# Patient Record
Sex: Female | Born: 1965 | Race: White | Hispanic: No | Marital: Married | Smoking: Never smoker
Health system: Southern US, Community
[De-identification: ages and names within clinical notes are randomized; demographics above are authoritative.]

## PROBLEM LIST (undated history)

## (undated) DIAGNOSIS — N83209 Unspecified ovarian cyst, unspecified side: Secondary | ICD-10-CM

## (undated) HISTORY — PX: CHOLECYSTECTOMY: SHX55

---

## 2009-09-29 ENCOUNTER — Ambulatory Visit: Payer: Self-pay | Admitting: Family Medicine

## 2009-09-30 ENCOUNTER — Encounter: Payer: Self-pay | Admitting: Obstetrics & Gynecology

## 2009-09-30 LAB — CONVERTED CEMR LAB
ALT: <8 units/L (ref 0–35)
Alkaline Phosphatase: 69 units/L (ref 39–117)
Cholesterol: 188 mg/dL (ref 0–200)
Glucose, Bld: 95 mg/dL (ref 70–99)
HCT: 35.6 % — ABNORMAL LOW (ref 36.0–46.0)
HDL: 45 mg/dL (ref >39)
Hemoglobin: 10 g/dL — ABNORMAL LOW (ref 12.0–15.0)
MCV: 77.9 fL — ABNORMAL LOW (ref 78.0–100.0)
Potassium: 3.6 meq/L (ref 3.5–5.3)
RBC: 4.57 M/uL (ref 3.87–5.11)
RDW: 18.2 % — ABNORMAL HIGH (ref 11.5–15.5)
Sodium: 140 meq/L (ref 135–145)
TSH: 3.203 microintl units/mL (ref 0.350–4.500)
VLDL: 33 mg/dL (ref 0–40)
WBC: 7.7 10*3/uL (ref 4.0–10.5)

## 2009-10-14 ENCOUNTER — Encounter: Admission: RE | Admit: 2009-10-14 | Discharge: 2009-10-14 | Payer: Self-pay | Admitting: Obstetrics & Gynecology

## 2009-10-21 ENCOUNTER — Ambulatory Visit: Payer: Self-pay | Admitting: Obstetrics & Gynecology

## 2009-10-29 ENCOUNTER — Encounter
Admission: RE | Admit: 2009-10-29 | Discharge: 2009-10-29 | Payer: Federal, State, Local not specified - PPO | Admitting: Obstetrics & Gynecology

## 2009-10-29 IMAGING — US US BREAST BILATERAL
1 series · 13 of 25 positions shown · non-contrast
Comparison: [DATE]

CLINICAL DATA: Screening call back for masses and calcifications
in both breasts

DIGITAL DIAGNOSTIC BILATERAL MAMMOGRAM WITHOUT CAD AND BILATERAL
BREAST ULTRASOUND:

[Series 1: us breast bilateral · 13 of 32 slices shown]
[im 1/32]
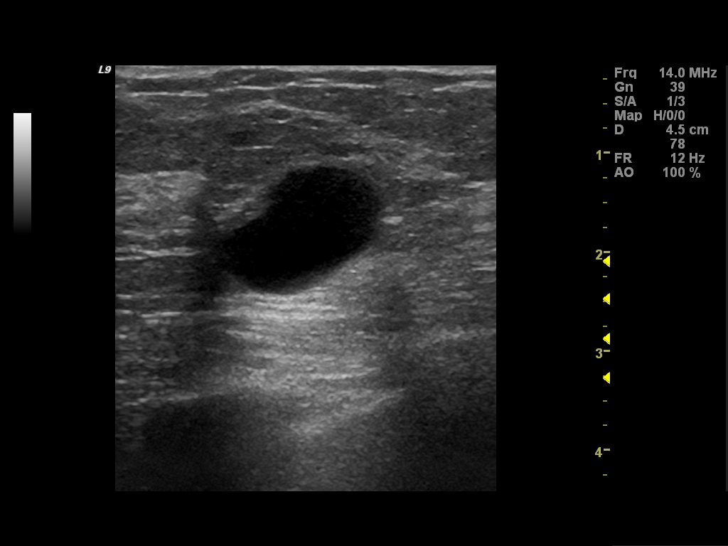
[im 3/32]
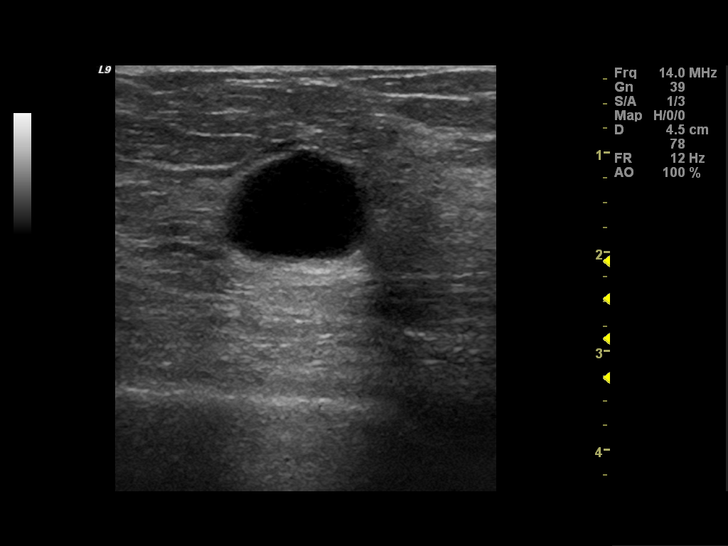
[im 6/32]
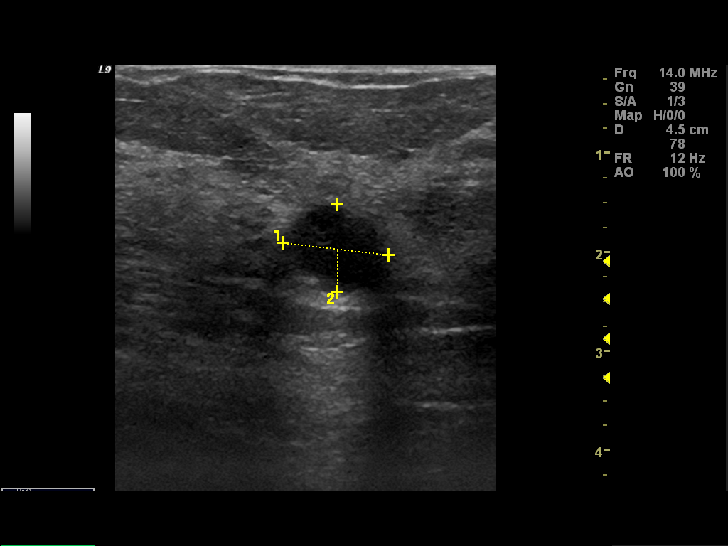
[im 8/32]
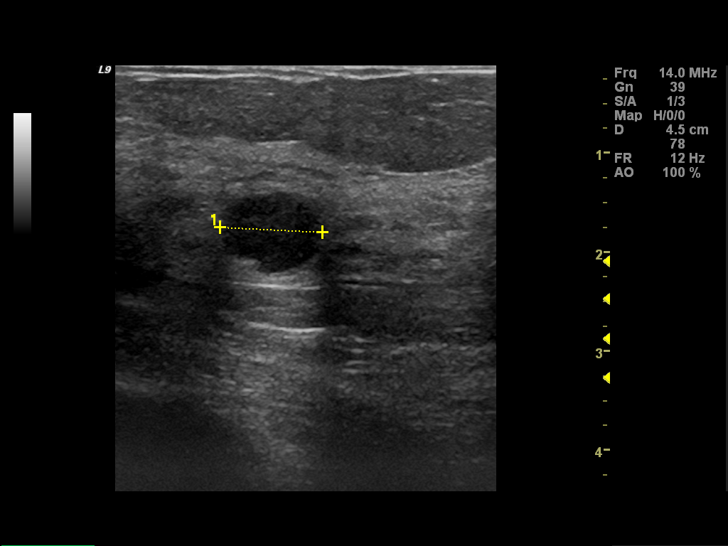
[im 11/32]
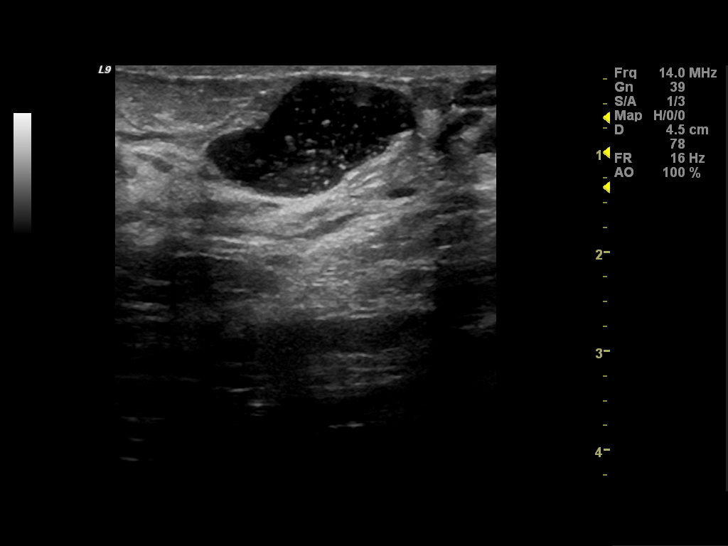
[im 13/32]
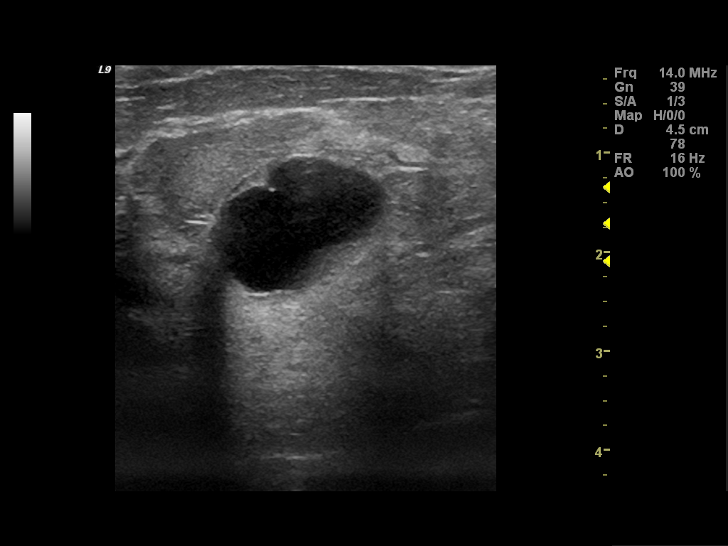
[im 16/32]
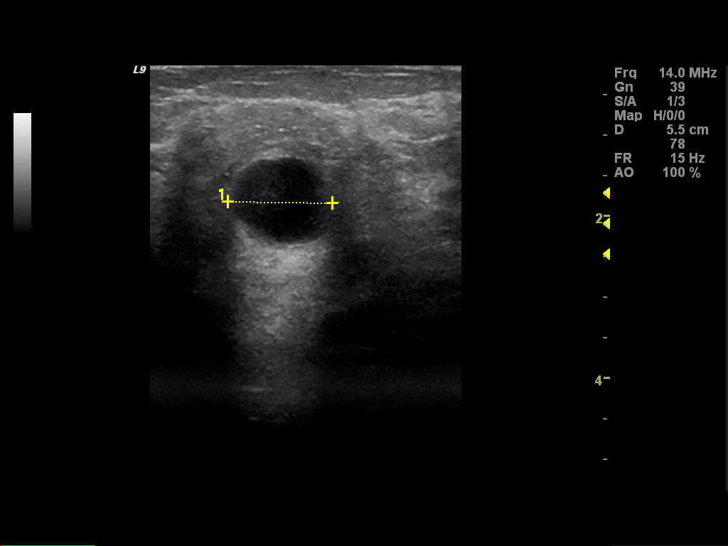
[im 19/32]
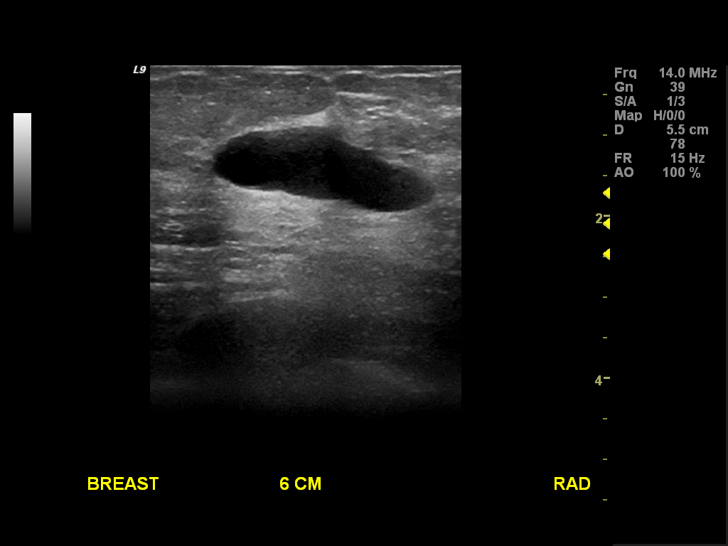
[im 21/32]
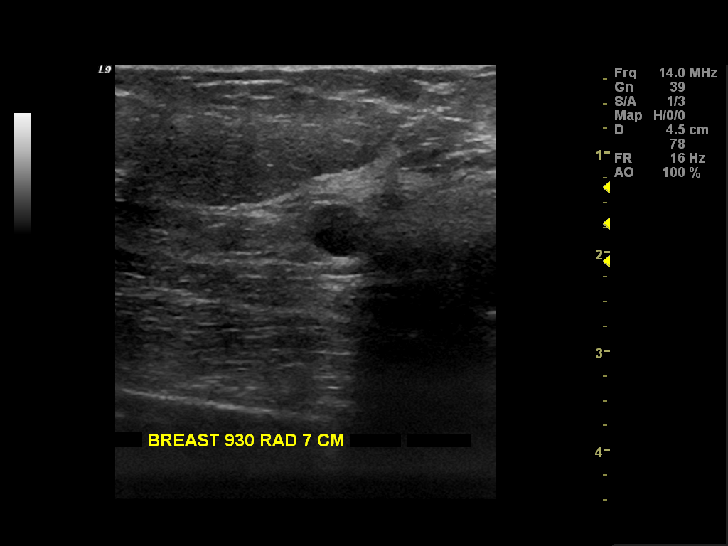
[im 24/32]
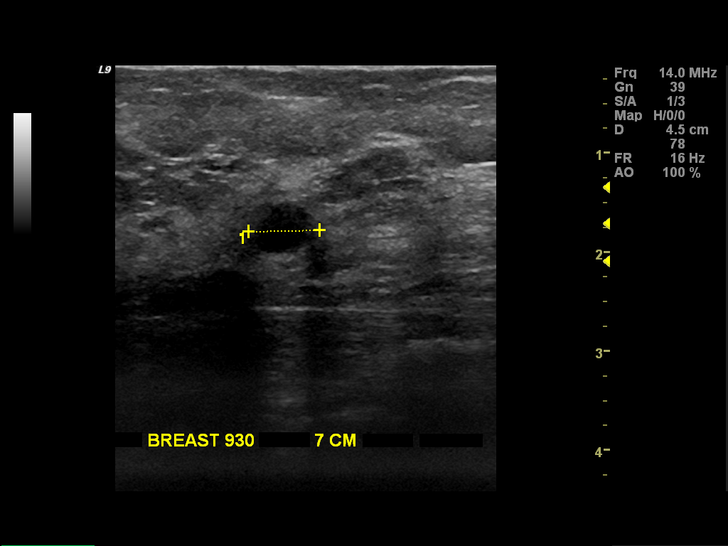
[im 26/32]
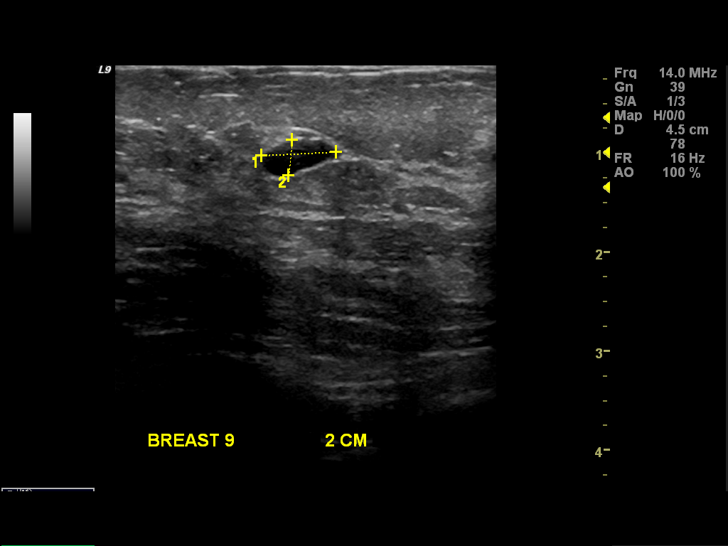
[im 29/32]
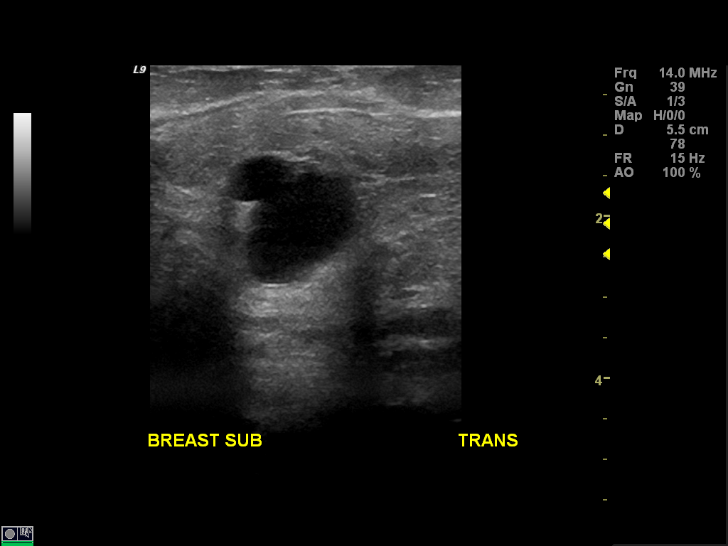
[im 32/32]
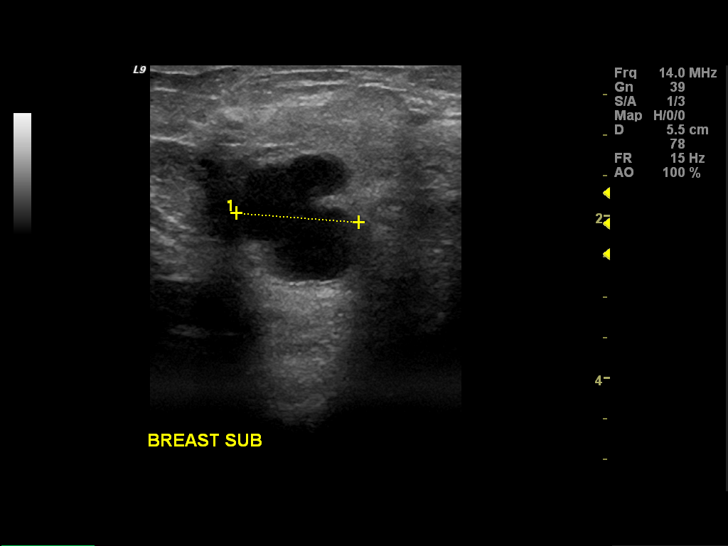

[13 of 25 positions shown; findings below may reference images not displayed]

FINDINGS: Spot magnification views confirm the presence of
calcifications in both breasts.  The calcifications and the left
breast are spanning zones A and B, promptly punctate with some
amorphous forms which layer on the 90 degree lateral views
suggesting some relate to milk of calcium.  No linear forms or
linear orientation is seen.  On the right, the calcifications span
zone A and B, are predominately punctate and round without linear
forms or linear orientation.  Circumscribed masses are seen in the
breast, bilaterally.

On physical exam, I palpate mobile mass in the breast bilaterally.

Ultrasound is performed, showing numerous cysts.  In the left
breast, three cysts are imaged including a 1.6 x 1.3 x 1.4 cm cyst
in the 2 o'clock position, 5 cm from the nipple, 1.1 x 0.9 x 1.0 cm
in the 2 o'clock position, 4 cm from the left nipple and 1.6 x
x 2.1 cm in the 12 o'clock position, 1 cm from the nipple.

On the right side, several cysts are also imaged including a 1.7 x
1.3 x 1.3 cm in the 12 o'clock position, 2 cm from the nipple,
x x 1.1 x 2.7 cm in the [DATE] position, 6 cm from the nipple, 0.7 x
0.6 x 0.7 cm in the [DATE] position, 7 cm from the nipple, 0.8 x
x 0.8 cm in the 9 o'clock position, 2 cm from the nipple and 1.7 x
1.5 x 1.5 cm in the right subareolar region.  These cysts
correspond to the masses seen mammographically.
IMPRESSION: Cysts bilaterally.  Benign calcifications, bilaterally.  Recommend
routine screening mammography in 1 year.  Prior films will be
requested for comparison and if necessary, an addendum will be
placed on this report.

BI-RADS CATEGORY 2:  Benign finding(s).

## 2009-12-28 ENCOUNTER — Ambulatory Visit (HOSPITAL_COMMUNITY): Admission: RE | Admit: 2009-12-28 | Discharge: 2009-12-28 | Payer: Self-pay | Admitting: Obstetrics & Gynecology

## 2009-12-28 ENCOUNTER — Ambulatory Visit: Payer: Self-pay | Admitting: Obstetrics & Gynecology

## 2010-05-26 LAB — CBC
MCHC: 31.3 g/dL (ref 30.0–36.0)
MCV: 75.5 fL — ABNORMAL LOW (ref 78.0–100.0)
RBC: 4.09 MIL/uL (ref 3.87–5.11)

## 2010-07-27 NOTE — Assessment & Plan Note (Signed)
Sydney Cantu NO.:  [id number]   MEDICAL RECORD NO.:  [id number]          PATIENT TYPE:  POB   LOCATION:  [facility] at [place]         FACILITY:  [facility]   PHYSICIAN:  Tinnie Gens, MD        DATE OF BIRTH:  08/13/1965   DATE OF SERVICE:  09/29/2009                                  CLINIC NOTE   CHIEF COMPLAINT:  Yearly exam.   HISTORY OF PRESENT ILLNESS:  The patient is a 45 year old gravida 4,  para 4-0-0-3, who has not been to see a doctor for sometime.  Her last  Pap was in 2007.  She has no history of abnormal Pap smears, but she  continues to have regular cycles, although her bleeding has gotten  heavier over the last couple of years and in fact she reports that her  bleeding is a lot heavier than it used to be.  She continues to have  monthly cycles until this last month.  Her LMP is Jul 28, 2009.  Her UPT  is negative today.  Her partner has had a vasectomy.  She does not need  a birth control.   PAST MEDICAL HISTORY:  Negative.   PAST SURGICAL HISTORY:  She had a laparoscopy for left lower quadrant  pain, had a scar tissue removed from her ovary and a laparoscopic  cholecystectomy and had a lumpectomy that was benign in 2006.   MEDICATIONS:  She is on none.   ALLERGIES:  None known.   OBSTETRICAL HISTORY:  She is G4, P4, she lost 1 child due to leukemia.  She has had 4 vaginal deliveries including a 10-pound 6-ounce baby in  her last pregnancy.   GYNECOLOGIC HISTORY:  Menarche at age 1.  Cycles are monthly every 28  days, lasts 4 days with minimal pain.  She has no history of abnormal  Pap smear or STD.   FAMILY HISTORY:  Significant for diabetes, heart disease, hypertension  and colon cancer in her mother at age 19.   SOCIAL HISTORY:  She is a stay-at-home mom.  She does not smoke, drink  or do any other drugs.  She is married.   REVIEW OF SYSTEMS:  A 14-point review of systems reviewed.  Please see  GYN history in the chart, but is  negative.   PHYSICAL EXAMINATION:  VITAL SIGNS:  Reviewed, her blood pressure is  144/87, weight 178 today.  GENERAL:  She is a well-developed, well-nourished female in no acute  distress.  HEENT:  Normocephalic, atraumatic.  Sclerae anicteric.  NECK:  Supple.  Normal thyroid.  LUNGS:  Clear bilaterally.  CV:  Regular rate and rhythm.  No rubs, gallops, or murmurs.  ABDOMEN:  Soft, nontender, nondistended.  GU:  Normal external female genitalia.  BUS is normal.  Vagina is pink  and rugated.  Cervix is parous without lesion.  Uterus is approximately  8 weeks' size, globular, firm, mobile and nontender.  Adnexa without  mass or tenderness.   IMPRESSION:  1. Gynecologic exam with Pap.  2. Menorrhagia.   PLAN:  1. Pap smear today.  2. Mammogram.  3. Lipid, CMP, CBC, TSH, FSH today.  4. Pelvic sonogram.  5. Return in 2 weeks for endometrial sampling given her heavy periods.           ______________________________  Tinnie Gens, MD     TP/MEDQ  D:  09/29/2009  T:  09/30/2009  Job:  [id number]

## 2010-07-27 NOTE — Assessment & Plan Note (Signed)
NAMELATESA, FRATTO NO.:  [id number]   MEDICAL RECORD NO.:  [id number]          PATIENT TYPE:  POB   LOCATION:  [facility] at [place]         FACILITY:  [facility]   PHYSICIAN:  Allie Bossier, MD        DATE OF BIRTH:  March 20, 1965   DATE OF SERVICE:  10/21/2009                                  CLINIC NOTE   Sydney Cantu is a 45 year old married white G4, P4, A1 who was seen by Dr.  Shawnie Pons on September 29, 2009, for the complaint of worsening menorrhagia over  the last several years.  She says that in the past she been treated for  anemia and has been attributed to her heavy periods.  Dr. Shawnie Pons did a  CBC which showed a hemoglobin to be 10.  Her cholesterol was normal.  Her TSH was normal and her FSH was 5.7.  A Pap smear was normal as well.  An ultrasound showed the endometrium to be thickened to 2.6-cm with one  and possibly two separate nodular-like areas that her consistent with  polyps.  Her ovaries appeared normal and her myometrium showed the  possibility of adenomyosis.   PAST MEDICAL HISTORY:  Negative.   PAST SURGICAL HISTORY:  Laparoscopy for left lower quadrant pain,  laparoscopic cholecystectomy, and a lumpectomy.   MEDICATIONS:  None.   ALLERGIES:  No known drug allergies.  No LATEX allergies.  No food  allergies.   OBSTETRICAL HISTORY:  She has had 4 vaginal deliveries and one of her  children died due to leukemia.   FAMILY HISTORY:  Significant for diabetes, heart disease, hypertension,  and colon cancer.   SOCIAL HISTORY:  She is a single mom.  She denies alcohol, tobacco, or  drugs.  She is married.  Her husband has had a vasectomy.   REVIEW OF SYSTEMS:  Negative.   PHYSICAL EXAMINATION:  GENERAL:  Well-nourished, well-hydrated pleasant  white female.  VITAL SIGNS:  Height 5 feet 8 inches, weight 175, blood pressure 141/94,  pulse 92.  HEENT:  Normal.  HEART:  Regular rate and rhythm.  LUNGS:  Clear to auscultation bilaterally.  BREASTS:  Normal.  ABDOMEN:  Benign.  EXTERNAL GENITALIA:  Normal.  PELVIC:  Uterus is about 8-week size, globular, firm, mobile, nontender.  Adnexa are without masses or tenderness.   ASSESSMENT AND PLAN:  Menorrhagia resulting in anemia.  Instead of doing  an endometrial biopsy, I have recommended that we do a D and C and  hysteroscopy because of the presence of polyps.  Risks of surgery  explained, understood, and accepted, and I will see her.  She wishes to  have a surgery scheduled in about a month because her husband has just  had surgery on his right foot and somewhat incapacitated.      Allie Bossier, MD     MCD/MEDQ  D:  10/21/2009  T:  10/22/2009  Job:  [id number]

## 2011-12-22 IMAGING — US US PELVIS COMPLETE MODIFY
1 series · 13 of 25 positions shown · non-contrast
Comparison: None.

CLINICAL DATA: Irregular menses.  Heavy painful periods.  LMP
approximately 2 weeks ago.



[Series 1: us pelvis complete modify · 0.28mm/px · 13 of 54 slices shown]
[im 1/54]
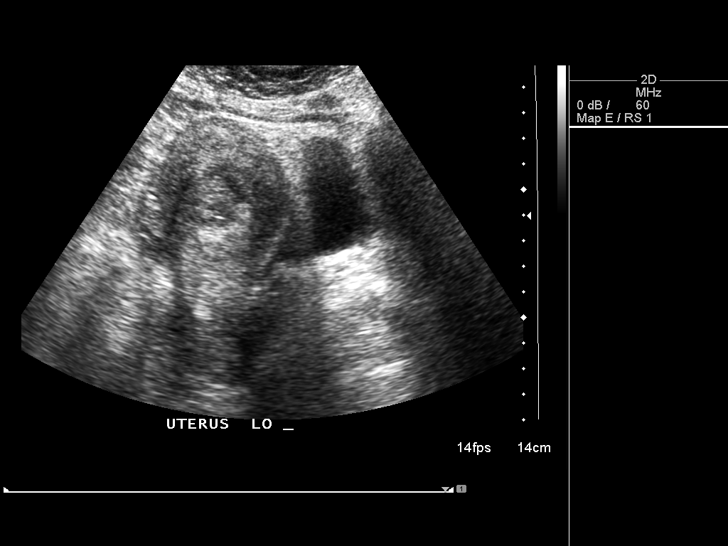
[im 5/54]
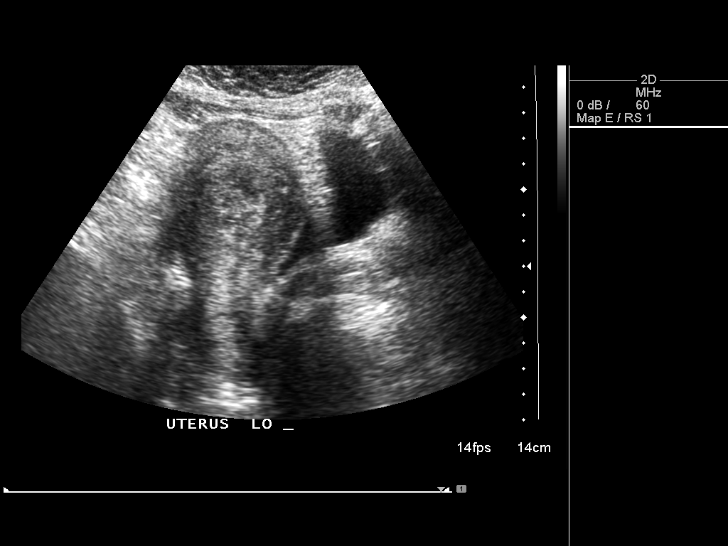
[im 9/54]
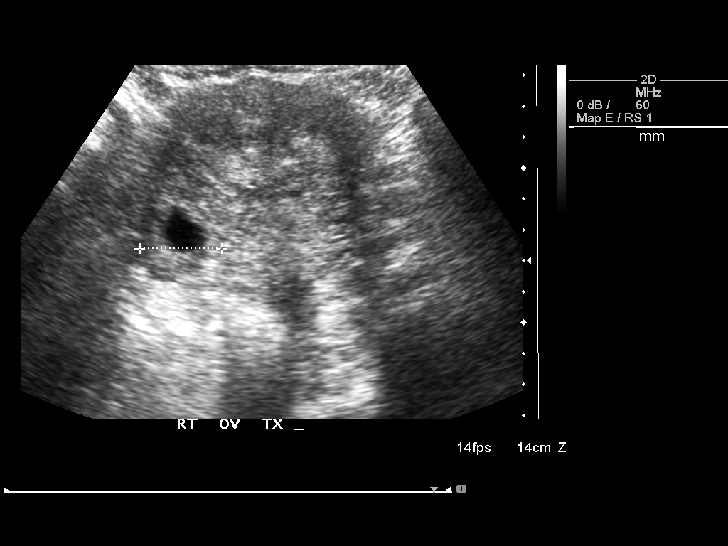
[im 14/54]
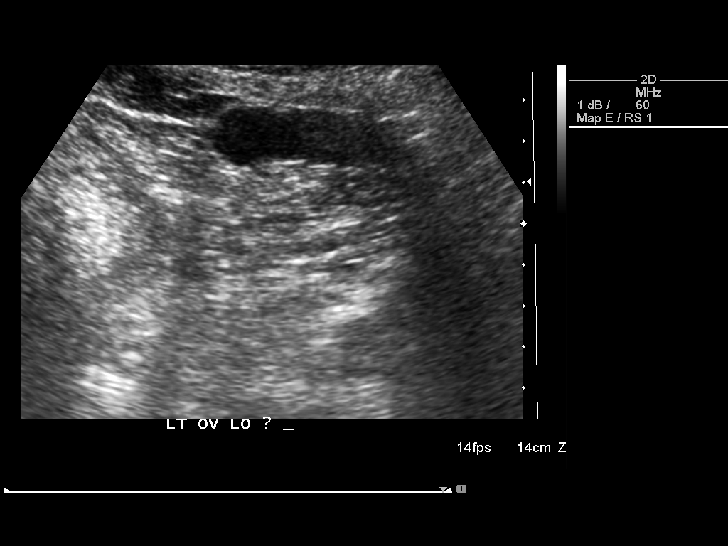
[im 18/54]
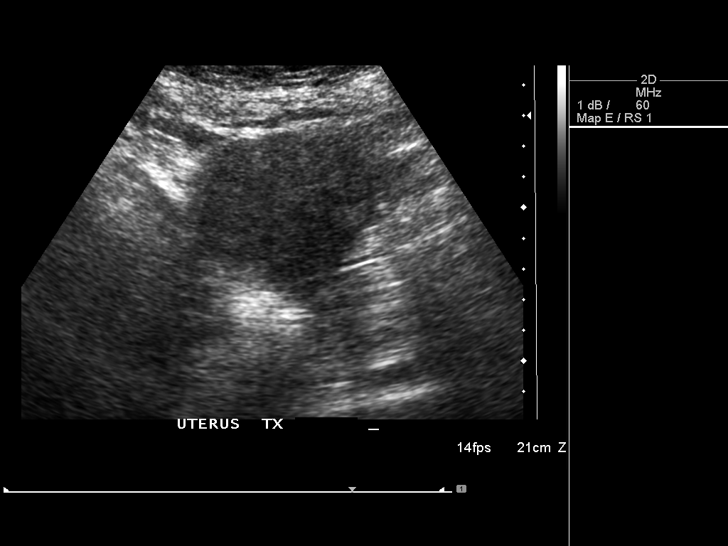
[im 23/54]
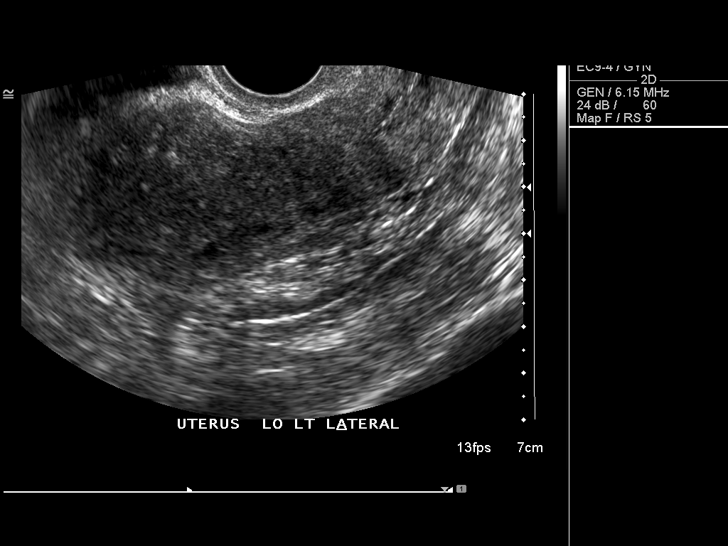
[im 27/54]
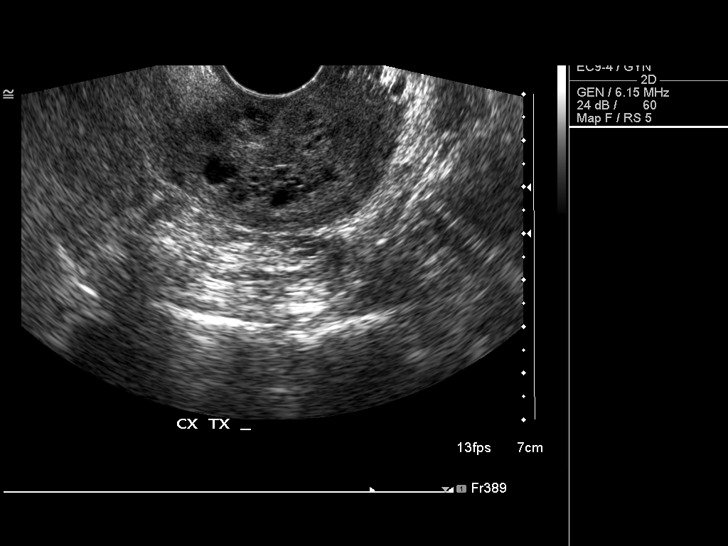
[im 31/54]
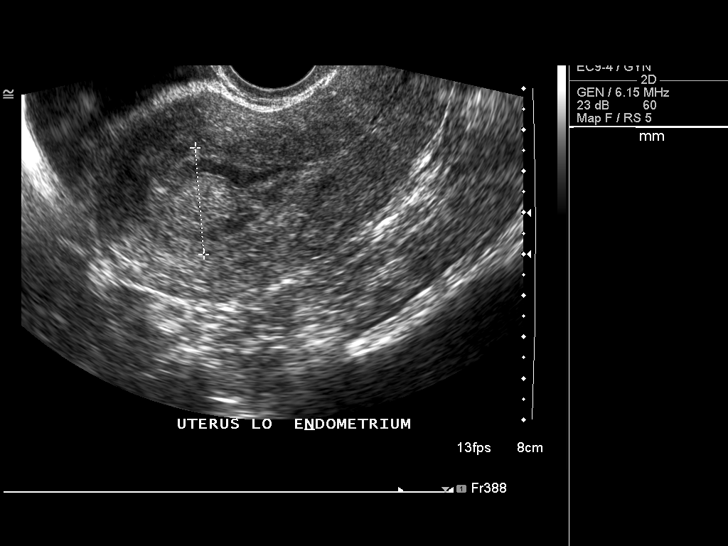
[im 36/54]
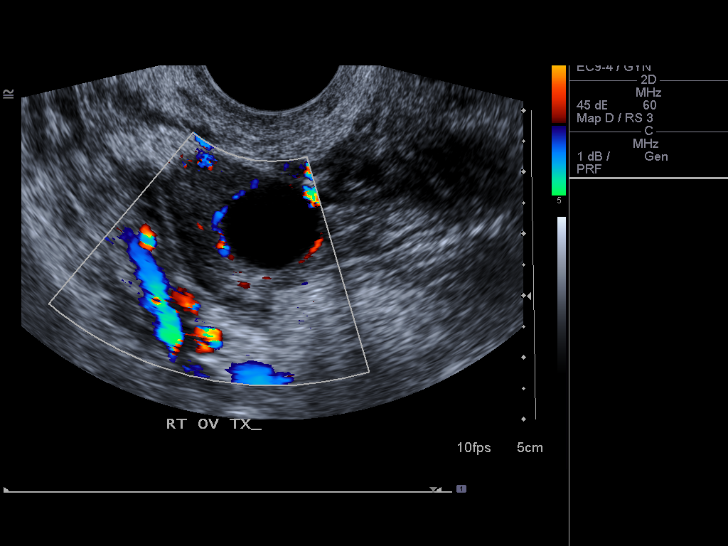
[im 40/54]
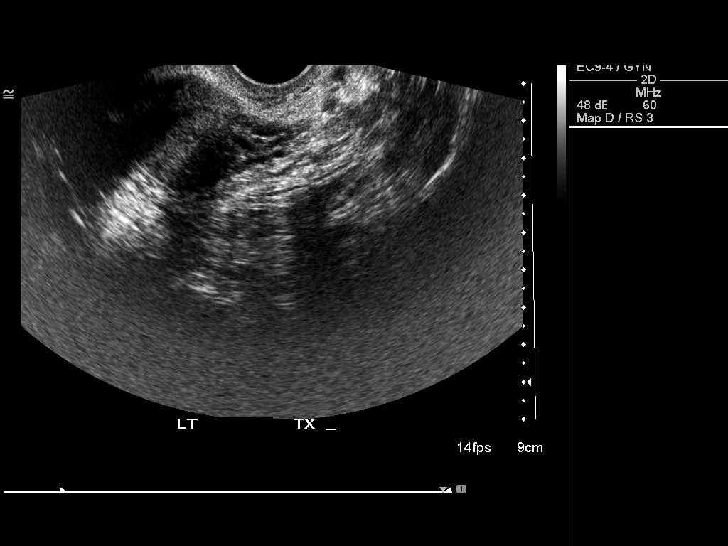
[im 45/54]
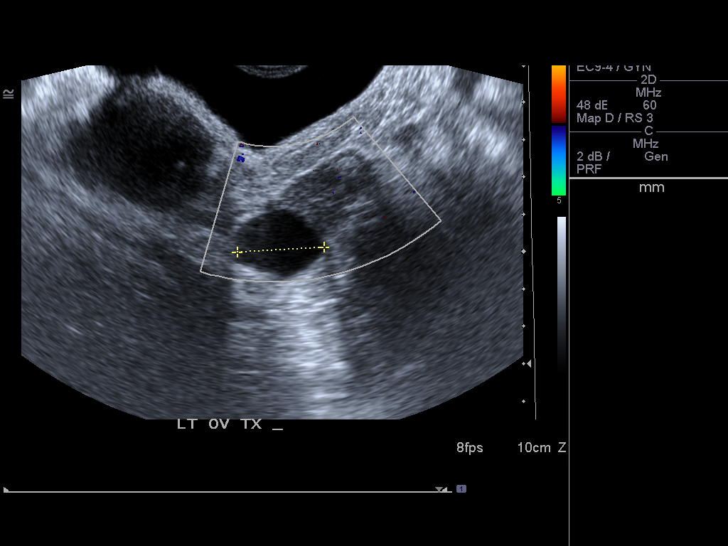
[im 49/54]
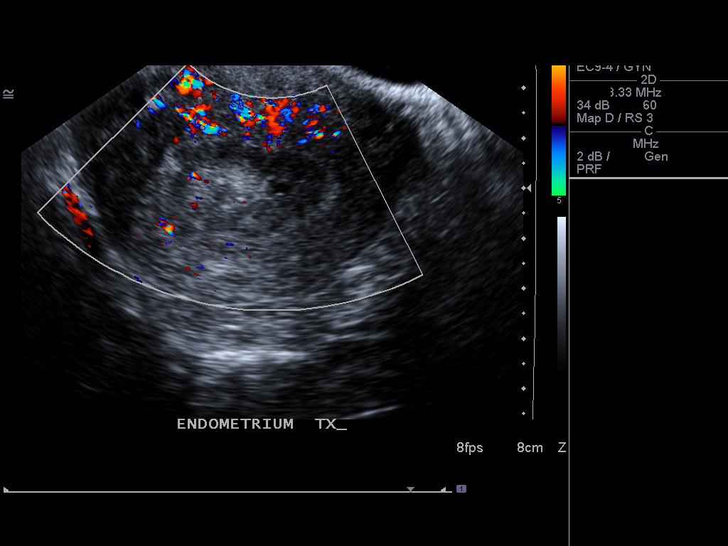
[im 54/54]
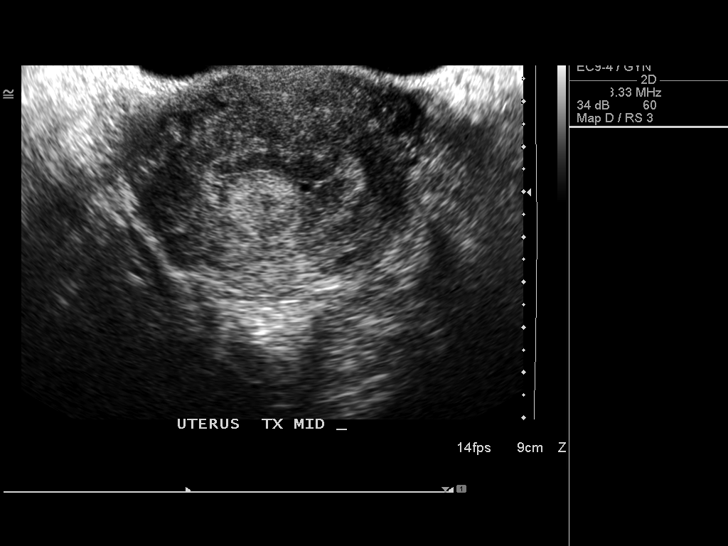

[13 of 25 positions shown; findings below may reference images not displayed]

FINDINGS: Uterus measures 10.5 cm length by 5.1 centers AP diameter by 6.3 cm
and width.  No focal uterine mass is identified.  The echotexture
is mildly heterogeneous.

Endometrium is abnormal.  The endometrial canal is thickened to
cm.  There is at least one and possibly two separate echogenic
nodular mass-like areas projecting into the endometrial canal from
the uterine fundus, and surrounded by a rim of endometrial fluid.
Color vascular flow is identified to these nodular areas, the
largest of which measures 1.4 cm AP diameter, and the smaller and
more anterior of which measures 0.8 cm.

Right Ovary measures 3.8 x 2.1 x 2.4 cm.  Normal in size, contains
a dominant follicle.  Color Doppler flow is identified to the right
ovary.

Left Ovary measures 4.1 x 3.0 x 2.7 cm and contains a dominant
follicle.

Other Findings:  No free pelvic fluid is identified.
IMPRESSION: 1.  Nodular echogenic thickening with associated vascular flow of
the endometrium as described above.  Primary consideration is of
endometrial polyp(s).  Focal endometrial hyperplasia or submucosal
fibroids are less likely considerations.
2.  Heterogeneous myometrium raises the possibility of adenomyosis.
3.  Ovaries within normal limits.

## 2016-09-20 ENCOUNTER — Emergency Department (HOSPITAL_COMMUNITY)
Admission: EM | Admit: 2016-09-20 | Discharge: 2016-09-20 | Disposition: A | Discharge status: Home / Self Care | Payer: Federal, State, Local not specified - PPO | Attending: Emergency Medicine | Admitting: Emergency Medicine

## 2016-09-20 ENCOUNTER — Encounter (HOSPITAL_COMMUNITY): Payer: Self-pay | Admitting: Emergency Medicine

## 2016-09-20 ENCOUNTER — Emergency Department (HOSPITAL_COMMUNITY): Payer: Federal, State, Local not specified - PPO

## 2016-09-20 DIAGNOSIS — R1032 Left lower quadrant pain: Secondary | ICD-10-CM | POA: Diagnosis present

## 2016-09-20 DIAGNOSIS — N200 Calculus of kidney: Secondary | ICD-10-CM

## 2016-09-20 DIAGNOSIS — N201 Calculus of ureter: Secondary | ICD-10-CM | POA: Diagnosis not present

## 2016-09-20 DIAGNOSIS — M545 Low back pain: Secondary | ICD-10-CM | POA: Diagnosis not present

## 2016-09-20 DIAGNOSIS — R319 Hematuria, unspecified: Secondary | ICD-10-CM | POA: Diagnosis not present

## 2016-09-20 HISTORY — DX: Unspecified ovarian cyst, unspecified side: N83.209

## 2016-09-20 LAB — URINALYSIS, ROUTINE W REFLEX MICROSCOPIC
Bilirubin Urine: NEGATIVE
GLUCOSE, UA: NEGATIVE mg/dL
KETONES UR: 20 mg/dL — AB
Leukocytes, UA: NEGATIVE
NITRITE: NEGATIVE
PROTEIN: 30 mg/dL — AB
Specific Gravity, Urine: 1.014 (ref 1.005–1.030)
Squamous Epithelial / LPF: NONE SEEN
pH: 6 (ref 5.0–8.0)

## 2016-09-20 LAB — CBC WITH DIFFERENTIAL/PLATELET
BASOS ABS: 0 10*3/uL (ref 0.0–0.1)
BASOS PCT: 0 %
EOS ABS: 0 10*3/uL (ref 0.0–0.7)
Eosinophils Relative: 0 %
HCT: 46 % (ref 36.0–46.0)
HEMOGLOBIN: 15.3 g/dL — AB (ref 12.0–15.0)
Lymphocytes Relative: 26 %
Lymphs Abs: 2.4 10*3/uL (ref 0.7–4.0)
MCH: 30.6 pg (ref 26.0–34.0)
MCHC: 33.3 g/dL (ref 30.0–36.0)
MCV: 92 fL (ref 78.0–100.0)
Monocytes Absolute: 0.5 10*3/uL (ref 0.1–1.0)
Monocytes Relative: 6 %
NEUTROS PCT: 68 %
Neutro Abs: 6.4 10*3/uL (ref 1.7–7.7)
Platelets: 143 10*3/uL — ABNORMAL LOW (ref 150–400)
RBC: 5 MIL/uL (ref 3.87–5.11)
RDW: 12.7 % (ref 11.5–15.5)
WBC: 9.4 10*3/uL (ref 4.0–10.5)

## 2016-09-20 LAB — COMPREHENSIVE METABOLIC PANEL
ALK PHOS: 94 U/L (ref 38–126)
ALT: 20 U/L (ref 14–54)
AST: 22 U/L (ref 15–41)
Albumin: 3.9 g/dL (ref 3.5–5.0)
Anion gap: 14 (ref 5–15)
BUN: 13 mg/dL (ref 6–20)
CALCIUM: 9.1 mg/dL (ref 8.9–10.3)
CO2: 19 mmol/L — ABNORMAL LOW (ref 22–32)
CREATININE: 1.25 mg/dL — AB (ref 0.44–1.00)
Chloride: 104 mmol/L (ref 101–111)
GFR, EST AFRICAN AMERICAN: 56 mL/min — AB (ref >60)
GFR, EST NON AFRICAN AMERICAN: 49 mL/min — AB (ref >60)
Glucose, Bld: 198 mg/dL — ABNORMAL HIGH (ref 65–99)
Potassium: 3.2 mmol/L — ABNORMAL LOW (ref 3.5–5.1)
Sodium: 137 mmol/L (ref 135–145)
Total Bilirubin: 0.7 mg/dL (ref 0.3–1.2)
Total Protein: 7.3 g/dL (ref 6.5–8.1)

## 2016-09-20 MED ORDER — KETOROLAC TROMETHAMINE 30 MG/ML IJ SOLN
30.0000 mg | Freq: Once | INTRAMUSCULAR | Status: AC
  Administered 2016-09-20: 30 mg via INTRAVENOUS
  Filled 2016-09-20: qty 1

## 2016-09-20 MED ORDER — MORPHINE SULFATE (PF) 4 MG/ML IV SOLN
4.0000 mg | Freq: Once | INTRAVENOUS | Status: AC
  Administered 2016-09-20: 4 mg via INTRAVENOUS
  Filled 2016-09-20: qty 1

## 2016-09-20 MED ORDER — ONDANSETRON HCL 4 MG/2ML IJ SOLN
4.0000 mg | Freq: Once | INTRAMUSCULAR | Status: AC
  Administered 2016-09-20: 4 mg via INTRAVENOUS
  Filled 2016-09-20: qty 2

## 2016-09-20 MED ORDER — OXYCODONE-ACETAMINOPHEN 5-325 MG PO TABS: 1.0000 | ORAL_TABLET | Freq: Four times a day (QID) | ORAL | 0 refills | Status: AC | PRN

## 2016-09-20 NOTE — Discharge Instructions (Signed)
Percocet as prescribed as needed for pain.  Follow-up with [facility] if your stone has not passed in the next 2-3 days. The contact information has been provided in this discharge summary for you to call and make these arrangements.  Return to the emergency department in the meantime if you develop worsening pain, high fevers, or other new and concerning symptoms.

## 2016-09-20 NOTE — ED Provider Notes (Signed)
[facility]EMERGENCY DEPT Provider Note   CSN: [id number] Arrival date & time: 09/20/16  4098     History   Chief Complaint Chief Complaint  Patient presents with  . Back Pain  . Tingling  . Groin Pain    HPI Sydney Cantu is a 51 y.o. female.  Patient is a 51 year old female with no significant past medical history presenting for evaluation of severe left flank and left lower quadrant pain. This started at 5 AM and woke her from sleep. She went to bed the night before feeling fine. She reports some nausea, but no vomiting. She denies any bowel or bladder complaints.   The history is provided by the patient.  Back Pain   This is a new problem. Episode onset: 5 AM. The problem occurs constantly. The problem has been rapidly worsening. The pain is associated with no known injury. Pain location: Left flank. The quality of the pain is described as stabbing. Radiates to: Left groin. The pain is at a severity of 10/10. The pain is severe. She has tried nothing for the symptoms.    Past Medical History:  Diagnosis Date  . Ovarian cyst     There are no active problems to display for this patient.   Past Surgical History:  Procedure Laterality Date  . CHOLECYSTECTOMY      OB History    No data available       Home Medications    Prior to Admission medications   Not on File    Family History History reviewed. No pertinent family history.  Social History Social History  Substance Use Topics  . Smoking status: Never Smoker  . Smokeless tobacco: Never Used  . Alcohol use Yes     Comment: occassional     Allergies   Patient has no known allergies.   Review of Systems Review of Systems  Musculoskeletal: Positive for back pain.  All other systems reviewed and are negative.    Physical Exam Updated Vital Signs BP 133/74   Pulse 69   Temp (!) 97.4 F (36.3 C) (Oral)   Resp 11   Ht 5\' 9"  (1.753 m)   Wt 86.2 kg (190 lb)   SpO2 99%   BMI 28.06 kg/m    Physical Exam  Constitutional: She is oriented to person, place, and time. She appears well-developed and well-nourished. No distress.  Patient is a 51 year old female who appears uncomfortable.  HENT:  Head: Normocephalic and atraumatic.  Neck: Normal range of motion. Neck supple.  Cardiovascular: Normal rate and regular rhythm.  Exam reveals no gallop and no friction rub.   No murmur heard. Pulmonary/Chest: Effort normal and breath sounds normal. No respiratory distress. She has no wheezes.  Abdominal: Soft. Bowel sounds are normal. She exhibits no distension. There is tenderness. There is no rebound and no guarding.  There is left-sided CVA tenderness and left lower quadrant abdominal tenderness present.  Musculoskeletal: Normal range of motion.  Neurological: She is alert and oriented to person, place, and time.  Skin: Skin is warm and dry. She is not diaphoretic.  Nursing note and vitals reviewed.    ED Treatments / Results  Labs (all labs ordered are listed, but only abnormal results are displayed) Labs Reviewed  COMPREHENSIVE METABOLIC PANEL - Abnormal; Notable for the following:       Result Value   Potassium 3.2 (*)    CO2 19 (*)    Glucose, Bld 198 (*)    Creatinine, Ser 1.25 (*)  GFR calc non Af Amer 49 (*)    GFR calc Af Amer 56 (*)    All other components within normal limits  CBC WITH DIFFERENTIAL/PLATELET - Abnormal; Notable for the following:    Hemoglobin 15.3 (*)    Platelets 143 (*)    All other components within normal limits  URINALYSIS, ROUTINE W REFLEX MICROSCOPIC - Abnormal; Notable for the following:    Hgb urine dipstick LARGE (*)    Ketones, ur 20 (*)    Protein, ur 30 (*)    Bacteria, UA RARE (*)    All other components within normal limits    EKG  EKG Interpretation None       Radiology Ct Renal Stone Study  Result Date: 09/20/2016 CLINICAL DATA:  51 year old female with left flank pain. Prior cholecystectomy. Initial encounter.  EXAM: CT ABDOMEN AND PELVIS WITHOUT CONTRAST TECHNIQUE: Multidetector CT imaging of the abdomen and pelvis was performed following the standard protocol without IV contrast. COMPARISON:  No comparison CT. FINDINGS: Lower chest: Basilar subsegmental atelectasis/ scarring. Heart size within normal limits. Breast parenchyma incompletely assessed. Hepatobiliary: Slightly elongated liver spanning over 18.6 cm with mild fatty infiltration. Taking into account limitation by non contrast imaging, no worrisome hepatic lesion. Post cholecystectomy. Pancreas: Taking into account limitation by non contrast imaging, no mass or inflammation. Spleen: Taking into account limitation by non contrast imaging, no mass or enlargement. Adrenals/Urinary Tract: 6.4 mm distal left ureteral obstructing stone located 1 cm proximal to the left ureterovesical junction is causing moderate left hydroureteronephrosis with slight extravasation of urine along the ureter. Three nonobstructing left lower pole renal calculi measuring up to 2.8 mm. Scarred atrophic lobular appearing right kidney containing a few nonobstructing renal calculi measuring up to 2 mm. No adrenal mass. Within limitations of noncontrast imaging, no worrisome renal mass. Partially contracted urinary bladder without abnormality. Stomach/Bowel: Under distension of the stomach and colon slightly limits evaluation. No extraluminal bowel inflammatory process, free fluid or free air. No inflammation surrounds the appendix which is directed superiorly inferior to the liver. Small number of diverticula. Ligament of Treitz distal left of midline. Vascular/Lymphatic: Trace aortic calcification without aortic aneurysm. No adenopathy. Reproductive: Endometrial lining appears slightly prominent for patient's age. This can be assessed with elective pelvic sonogram. No worrisome adnexal mass. Other: Diastases rectus muscles without bowel containing hernia. Displaced gallbladder surgical clips  right abdomen. Musculoskeletal: Mild curvature lumbar spine convex left. Minimal sacroiliac joint degenerative changes. IMPRESSION: 6.4 mm distal left ureteral obstructing stone located 1 cm proximal to the left ureterovesical junction is causing moderate left hydroureteronephrosis. Three nonobstructing left lower pole renal calculi measuring up to 2.8 mm. Scarred atrophic lobular appearing right kidney containing a few nonobstructing renal calculi measuring up to 2 mm. Endometrial lining appears slightly prominent for patient's age. This can be assessed with elective pelvic sonogram. Slightly elongated liver spanning over 18.6 cm with mild fatty infiltration. Post cholecystectomy. Electronically Signed   By: Lacy Duverney M.D.   On: 09/20/2016 09:08    Procedures Procedures (including critical care time)  Medications Ordered in ED Medications  ketorolac (TORADOL) 30 MG/ML injection 30 mg (30 mg Intravenous Given 09/20/16 0809)  ondansetron (ZOFRAN) injection 4 mg (4 mg Intravenous Given 09/20/16 0809)  morphine 4 MG/ML injection 4 mg (4 mg Intravenous Given 09/20/16 0809)     Initial Impression / Assessment and Plan / ED Course  I have reviewed the triage vital signs and the nursing notes.  Pertinent labs & imaging results  that were available during my care of the patient were reviewed by me and considered in my medical decision making (see chart for details).  Patient presents with acute left flank pain radiating into her left groin. Workup reveals hematuria and CT scan showing a left sided renal calculus with hydroureter and hydronephrosis. Stone measures 5 mm and is located in the distal left ureter. I feel as though this stands a good chance of passing on its own. She is feeling better after medications here in the ER and will be discharged with pain medication and follow-up with urology if not improving.  Final Clinical Impressions(s) / ED Diagnoses   Final diagnoses:  None    New  Prescriptions New Prescriptions   No medications on file     Geoffery Lyonselo, Fergie Sherbert, MD 09/20/16 [phone]

## 2016-09-20 NOTE — ED Notes (Signed)
Pt ambulated to restroom with no complaints and ambulated with a steady gait

## 2016-09-20 NOTE — ED Triage Notes (Signed)
Pt states she went to bed feeling normal. However, woke up at 5:30 this morning with terrible lower back back that radiates into her left groin. Pt also states bilateral hands and feet are tingling. Pt states she initially felt very nauseous. Denies headache at this time.

## 2019-05-19 ENCOUNTER — Ambulatory Visit: Payer: Federal, State, Local not specified - PPO | Attending: Internal Medicine

## 2019-05-19 DIAGNOSIS — Z23 Encounter for immunization: Secondary | ICD-10-CM | POA: Insufficient documentation

## 2019-05-19 NOTE — Progress Notes (Signed)
   Covid-19 Vaccination Clinic  Name:  AROURA VASUDEVAN    MRN: [id number] DOB: 04-11-1965  05/19/2019  Ms. Weyland was observed post Covid-19 immunization for 15 minutes without incident. She was provided with Vaccine Information Sheet and instruction to access the V-Safe system.   Ms. Queener was instructed to call 911 with any severe reactions post vaccine: Marland Kitchen Difficulty breathing  . Swelling of face and throat  . A fast heartbeat  . A bad rash all over body  . Dizziness and weakness   Immunizations Administered    Name Date Dose VIS Date Route   Pfizer COVID-19 Vaccine 05/19/2019  6:02 PM 0.3 mL 02/22/2019 Intramuscular   Manufacturer: [employer], [employer]   Lot: [id number]   NDC: [address]-4

## 2019-06-19 ENCOUNTER — Ambulatory Visit: Payer: Federal, State, Local not specified - PPO | Attending: Internal Medicine

## 2019-06-19 DIAGNOSIS — Z23 Encounter for immunization: Secondary | ICD-10-CM

## 2019-06-19 NOTE — Progress Notes (Signed)
   Covid-19 Vaccination Clinic  Name:  Sydney Cantu    MRN: [id number] DOB: 17-Dec-1965  06/19/2019  [name] was observed post Covid-19 immunization for 15 minutes without incident. She was provided with Vaccine Information Sheet and instruction to access the V-Safe system.   Ms. Curb was instructed to call 911 with any severe reactions post vaccine: Marland Kitchen Difficulty breathing  . Swelling of face and throat  . A fast heartbeat  . A bad rash all over body  . Dizziness and weakness   Immunizations Administered    Name Date Dose VIS Date Route   Pfizer COVID-19 Vaccine 06/19/2019  3:33 PM 0.3 mL 02/22/2019 Intramuscular   Manufacturer: [employer], [employer]   Lot: [id number]   NDC: [address]-7
